# Patient Record
Sex: Female | Born: 1945 | Race: White | Hispanic: No | Smoking: Never smoker
Health system: Southern US, Community
[De-identification: ages and names within clinical notes are randomized; demographics above are authoritative.]

## PROBLEM LIST (undated history)

## (undated) DIAGNOSIS — I639 Cerebral infarction, unspecified: Secondary | ICD-10-CM

## (undated) DIAGNOSIS — I890 Lymphedema, not elsewhere classified: Secondary | ICD-10-CM

## (undated) DIAGNOSIS — L03116 Cellulitis of left lower limb: Secondary | ICD-10-CM

## (undated) DIAGNOSIS — I10 Essential (primary) hypertension: Secondary | ICD-10-CM

---

## 2010-02-18 ENCOUNTER — Inpatient Hospital Stay: Payer: Self-pay | Admitting: Internal Medicine

## 2010-02-26 ENCOUNTER — Inpatient Hospital Stay: Payer: Self-pay | Admitting: Internal Medicine

## 2010-02-26 ENCOUNTER — Ambulatory Visit: Payer: Self-pay | Admitting: Internal Medicine

## 2010-03-07 ENCOUNTER — Other Ambulatory Visit: Payer: Self-pay | Admitting: Geriatric Medicine

## 2010-03-29 ENCOUNTER — Ambulatory Visit: Payer: Self-pay | Admitting: Internal Medicine

## 2011-07-23 IMAGING — NM NM BONE LIMITED
3 series · 16 of 16 positions shown · non-contrast
Comparison: none

REASON FOR EXAM: part 2
COMMENTS:

PROCEDURE:     NM  - NM LIMITED BONE SCAN SINGLE AREA  - February 28, 2010  [DATE]
RESULT:
Evaluation of the lower extremities was obtained utilizing three phase bone
scan status post right hand injection of 22.0 mCi of technetium 99m labeled
MDP.

[Series 1000: delays · 4.80mm/px · 3 acquisitions, 6 frames shown]
[im 1/3]
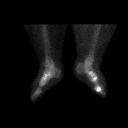
[im 1/3]
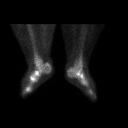
[im 2/3]
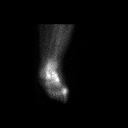
[im 2/3]
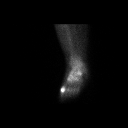
[im 3/3]
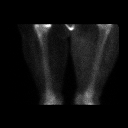
[im 3/3]
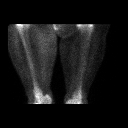

[Series 1000: immediate · 4.80mm/px · 2 acquisitions, 4 frames shown]
[im 1/2]
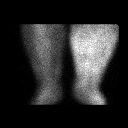
[im 1/2]
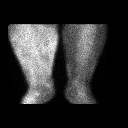
[im 2/2]
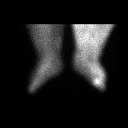
[im 2/2]
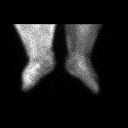

[Series 1000: flow · 4.80mm/px · 6 of 60 frames shown]
[frame 6/60  full-range]
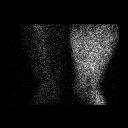
[frame 16/60  full-range]
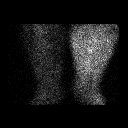
[frame 26/60  full-range]
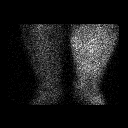
[frame 36/60  full-range]
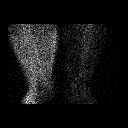
[frame 46/60  full-range]
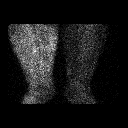
[frame 56/60  full-range]
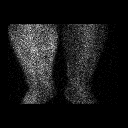

[16 of 16 positions shown; findings below may reference images not displayed]

FINDINGS: Diffuse hyperemia is identified within the distal left lower
extremity on the blood flow and blood pool images. No foci of abnormal
osseous remodeling are identified. On the delayed static images, punctate
foci of osseous remodeling project within the tarsals region of the left
foot as well as within the first tarsal. Vaguer areas are identified within
the tarsal regions of the right foot. These areas likely represent
degenerative change particularly considering the lack of increased
radiotracer activity within these regions on the blood flow and blood pool
imaging.
IMPRESSION: 1. Areas of osseous remodeling involving the tarsals on the left and to a
lesser extent the right as well as the first metatarsophalangeal region on
the left. These findings likely represent the sequela of degenerative
changes as described above.
2. Findings consistent with cellulitis involving the distal left lower
extremity.

## 2012-08-28 ENCOUNTER — Emergency Department: Payer: Self-pay | Admitting: Emergency Medicine

## 2012-08-28 LAB — CBC WITH DIFFERENTIAL/PLATELET
Basophil #: 0.1 10*3/uL (ref 0.0–0.1)
Eosinophil #: 0.3 10*3/uL (ref 0.0–0.7)
Eosinophil %: 2.9 %
HCT: 31.6 % — ABNORMAL LOW (ref 35.0–47.0)
HGB: 10.5 g/dL — ABNORMAL LOW (ref 12.0–16.0)
Lymphocyte %: 11.1 %
MCH: 28.2 pg (ref 26.0–34.0)
MCHC: 33.3 g/dL (ref 32.0–36.0)
Monocyte #: 0.8 x10 3/mm (ref 0.2–0.9)
Neutrophil #: 8.1 10*3/uL — ABNORMAL HIGH (ref 1.4–6.5)
Platelet: 226 10*3/uL (ref 150–440)
RBC: 3.72 10*6/uL — ABNORMAL LOW (ref 3.80–5.20)
WBC: 10.5 10*3/uL (ref 3.6–11.0)

## 2012-08-28 LAB — BASIC METABOLIC PANEL
Anion Gap: 4 — ABNORMAL LOW (ref 7–16)
BUN: 44 mg/dL — ABNORMAL HIGH (ref 7–18)
Calcium, Total: 9 mg/dL (ref 8.5–10.1)
Chloride: 104 mmol/L (ref 98–107)
Creatinine: 1.68 mg/dL — ABNORMAL HIGH (ref 0.60–1.30)
EGFR (African American): 36 — ABNORMAL LOW
EGFR (Non-African Amer.): 31 — ABNORMAL LOW
Potassium: 3.8 mmol/L (ref 3.5–5.1)
Sodium: 139 mmol/L (ref 136–145)

## 2015-02-02 ENCOUNTER — Encounter: Payer: Self-pay | Admitting: Emergency Medicine

## 2015-02-02 ENCOUNTER — Emergency Department: Payer: Medicare Other

## 2015-02-02 ENCOUNTER — Emergency Department
Admission: EM | Admit: 2015-02-02 | Discharge: 2015-02-02 | Disposition: A | Discharge status: Home / Self Care | Payer: Medicare Other | Attending: Emergency Medicine | Admitting: Emergency Medicine

## 2015-02-02 DIAGNOSIS — I10 Essential (primary) hypertension: Secondary | ICD-10-CM | POA: Diagnosis not present

## 2015-02-02 DIAGNOSIS — R531 Weakness: Secondary | ICD-10-CM | POA: Insufficient documentation

## 2015-02-02 DIAGNOSIS — R29898 Other symptoms and signs involving the musculoskeletal system: Secondary | ICD-10-CM

## 2015-02-02 DIAGNOSIS — Z91040 Latex allergy status: Secondary | ICD-10-CM | POA: Insufficient documentation

## 2015-02-02 HISTORY — DX: Morbid (severe) obesity due to excess calories: E66.01

## 2015-02-02 HISTORY — DX: Lymphedema, not elsewhere classified: I89.0

## 2015-02-02 HISTORY — DX: Cerebral infarction, unspecified: I63.9

## 2015-02-02 HISTORY — DX: Cellulitis of left lower limb: L03.116

## 2015-02-02 HISTORY — DX: Essential (primary) hypertension: I10

## 2015-02-02 LAB — CBC WITH DIFFERENTIAL/PLATELET
BASOS ABS: 0 10*3/uL (ref 0–0.1)
Basophils Relative: 0 %
EOS ABS: 0.4 10*3/uL (ref 0–0.7)
EOS PCT: 5 %
HCT: 28.2 % — ABNORMAL LOW (ref 35.0–47.0)
HEMOGLOBIN: 8.7 g/dL — AB (ref 12.0–16.0)
Lymphocytes Relative: 10 %
Lymphs Abs: 0.7 10*3/uL — ABNORMAL LOW (ref 1.0–3.6)
MCH: 29.4 pg (ref 26.0–34.0)
MCHC: 30.7 g/dL — ABNORMAL LOW (ref 32.0–36.0)
MCV: 95.7 fL (ref 80.0–100.0)
Monocytes Absolute: 0.8 10*3/uL (ref 0.2–0.9)
Monocytes Relative: 10 %
NEUTROS PCT: 75 %
Neutro Abs: 5.9 10*3/uL (ref 1.4–6.5)
PLATELETS: 272 10*3/uL (ref 150–440)
RBC: 2.95 MIL/uL — AB (ref 3.80–5.20)
RDW: 17.8 % — ABNORMAL HIGH (ref 11.5–14.5)
WBC: 7.8 10*3/uL (ref 3.6–11.0)

## 2015-02-02 LAB — URINALYSIS COMPLETE WITH MICROSCOPIC (ARMC ONLY)
BILIRUBIN URINE: NEGATIVE
Bacteria, UA: NONE SEEN
GLUCOSE, UA: NEGATIVE mg/dL
Hgb urine dipstick: NEGATIVE
Ketones, ur: NEGATIVE mg/dL
Leukocytes, UA: NEGATIVE
NITRITE: NEGATIVE
PH: 5 (ref 5.0–8.0)
Protein, ur: NEGATIVE mg/dL
Specific Gravity, Urine: 1.008 (ref 1.005–1.030)
WBC UA: NONE SEEN WBC/hpf (ref 0–5)

## 2015-02-02 LAB — BASIC METABOLIC PANEL
ANION GAP: 5 (ref 5–15)
BUN: 28 mg/dL — ABNORMAL HIGH (ref 6–20)
CHLORIDE: 104 mmol/L (ref 101–111)
CO2: 27 mmol/L (ref 22–32)
Calcium: 8.2 mg/dL — ABNORMAL LOW (ref 8.9–10.3)
Creatinine, Ser: 1.73 mg/dL — ABNORMAL HIGH (ref 0.44–1.00)
GFR, EST AFRICAN AMERICAN: 34 mL/min — AB (ref >60)
GFR, EST NON AFRICAN AMERICAN: 29 mL/min — AB (ref >60)
Glucose, Bld: 110 mg/dL — ABNORMAL HIGH (ref 65–99)
POTASSIUM: 4.1 mmol/L (ref 3.5–5.1)
SODIUM: 136 mmol/L (ref 135–145)

## 2015-02-02 LAB — PROTIME-INR
INR: 1.09
PROTHROMBIN TIME: 14.3 s (ref 11.4–15.0)

## 2015-02-02 LAB — APTT: aPTT: 34 seconds (ref 24–36)

## 2015-02-02 MED ORDER — HYDROCODONE-ACETAMINOPHEN 5-325 MG PO TABS
ORAL_TABLET | ORAL | Status: AC
  Administered 2015-02-02: 1 via ORAL
  Filled 2015-02-02: qty 1

## 2015-02-02 MED ORDER — HYDROCODONE-ACETAMINOPHEN 5-325 MG PO TABS
1.0000 | ORAL_TABLET | Freq: Once | ORAL | Status: AC
  Administered 2015-02-02: 1 via ORAL

## 2015-02-02 NOTE — ED Notes (Addendum)
Pt attempted to use bedpan, unable to collect sample. Skin breakdown noted on posterior inner thighs.

## 2015-02-02 NOTE — ED Notes (Signed)
Pt's legs wrapped according to the way they wrap them at home, non stick gauze placed to large wound to the posterior left lower leg, with abd pads and gauze and coban to hold intact to bilat lower extrem.

## 2015-02-02 NOTE — Clinical Social Work Note (Signed)
Clinical Social Work Assessment  Patient Details  Name: Rachel Craig MRN: [id number] Date of Birth: [birth year]  Date of referral:  02/02/15               Reason for consult:  [facility] Placement, [facility]                Permission sought to share information with:    Permission granted to share information::     Name::        Agency::     Relationship::     Contact Information:     Housing/Transportation Living arrangements for the past 2 months:  Single Family Home Source of Information:  Medical Team, Power of HastingsAttorney, Adult Children Patient Interpreter Needed:  None Criminal Activity/Legal Involvement Pertinent to Current Situation/Hospitalization:   (none reported) Significant Relationships:  Adult Children, Other Family Members Lives with:  Self Do you feel safe going back to the place where you live?   (per daughter patient needs help) Need for family participation in patient care:  Yes (Comment)  Care giving concerns:  Family is concerned that patient is not able to care for herself.   Social Worker assessment / plan:  Patient is 69 year old female that lives alone.  Patient is obese and sleeps in a power chair lift chair.  She has home health services with an RN that assist patient with her leg wraps. Daughter states they are also going to ask for a Child psychotherapistsocial worker to assist.   Per daughter the only way patient is able to travel is EMS as she is unable to get into a car.  Patient uses a walker to ambulate at home. Daughter stated notice patient is getting to the point of not being able to care for self, states patient has been total care as they assist with giving patient's bath and cooking her food.  Patient's PCP also comes to her home to see her.  CSW provided patient and family with a list of skilled nursing facilities, a blank FL2 to take to her PCP, contact phone and location for DSS, and a fact sheet with information on Medicaid for long term  care.  Employment status:  Disabled (Comment on whether or not currently receiving Disability) Insurance information:  Medicare Education officer, museum([employer]) PT Recommendations:  Not assessed at this time Information / Referral to community resources:  Skilled Holiday representativeursing Facility, Other (Comment Required) (information for Skilled and information on medicaid application process)  Patient/Family's Response to care:  Patient's daughters were appreciative of information provided by CSW and will follow up tomorrow.   Patient/Family's Understanding of and Emotional Response to Diagnosis, Current Treatment, and Prognosis:  Patient and daughter understands that patient is will not be admitted to the hospital, they will follow up with patient's PCP to complete FL2 and start the process to get patient placed in SNF.  Emotional Assessment Appearance:  Appears older than stated age Attitude/Demeanor/Rapport:    Affect (typically observed):    Orientation:  Oriented to Self, Oriented to Place, Oriented to  Time, Oriented to Situation Alcohol / Substance use:  Never Used Psych involvement (Current and /or in the community):  No (Comment)  Discharge Needs  Concerns to be addressed:  Care Coordination Readmission within the last 30 days:  No Current discharge risk:  Chronically ill, Dependent with Mobility, Lives alone Barriers to Discharge:  No Barriers Identified   Soundra PilonMoore, Deborah H, LCSW 02/02/2015, 5:41 PM

## 2015-02-02 NOTE — ED Notes (Signed)
Per pt's request in and out cath performed to empty pt's bladder, pt tol well, approx 600 ml's removed

## 2015-02-02 NOTE — ED Provider Notes (Signed)
[facility] Emergency Department Provider Note  ____________________________________________  Time seen: Seen upon arrival to the emergency department  I have reviewed the triage vital signs and the nursing notes.   HISTORY  Chief Complaint Weakness    HPI Rachel Craig is a 69 y.o. female with a history of morbid obesity, hypertension and lymphedema of the lower extremities was presenting with weakness to the bilateral lower 70s as well as pain to the left lower extremity. She says that the pain to the lower extremity has been there for about 2 weeks. She says that over the past day and a half dose she has been having weakness. She does take a Lasix 40 mg daily at home which she has been compliant with. She denies any increased swelling of her bilateral lower extremities. She says that the appearance as far as the redness is at her baseline. Denies any fever at home as well. Denies any other pain or nausea or vomiting.She says the weakness has progressed to the point where she has been unable to ambulate. Denies any pain in her back. Denies any urinary or bowel incontinence.   Past Medical History  Diagnosis Date  . Stroke (HCC)   . Hypertension   . Lymphedema of both lower extremities   . Hypertension   . Morbid obesity (HCC)   . Cellulitis of left lower extremity     There are no active problems to display for this patient.   History reviewed. No pertinent past surgical history.  No current outpatient prescriptions on file.  Allergies Latex and Other  History reviewed. No pertinent family history.  Social History Social History  Substance Use Topics  . Smoking status: Never Smoker   . Smokeless tobacco: None  . Alcohol Use: No    Review of Systems Constitutional: Bilateral lower extremity weakness Eyes: No visual changes. ENT: No sore throat. Cardiovascular: Denies chest pain. Respiratory: Denies shortness of  breath. Gastrointestinal: No abdominal pain.  No nausea, no vomiting.  No diarrhea.  No constipation. Genitourinary: Negative for dysuria. Musculoskeletal: Negative for back pain. Skin: Negative for rash. Neurological: Negative for headaches, focal weakness or numbness.  10-point ROS otherwise negative.  ____________________________________________   PHYSICAL EXAM:  VITAL SIGNS: ED Triage Vitals  Enc Vitals Group     BP 02/02/15 1330 145/51 mmHg     Pulse Rate 02/02/15 1330 72     Resp 02/02/15 1330 18     Temp 02/02/15 1330 98.2 F (36.8 C)     Temp Source 02/02/15 1330 Oral     SpO2 02/02/15 1330 95 %     Weight --      Height --      Head Cir --      Peak Flow --      Pain Score 02/02/15 1330 0     Pain Loc --      Pain Edu? --      Excl. in GC? --     Constitutional: Alert and oriented. Well appearing and in no acute distress. Morbidly obese. Eyes: Conjunctivae are normal. PERRL. EOMI. Head: Atraumatic. Nose: No congestion/rhinnorhea. Mouth/Throat: Mucous membranes are moist.  Oropharynx non-erythematous. Neck: No stridor.   Cardiovascular: Normal rate, regular rhythm. Grossly normal heart sounds.  Good peripheral circulation. Respiratory: Normal respiratory effort.  No retractions. Lungs CTAB. Gastrointestinal: Soft and nontender. No distention. No abdominal bruits. No CVA tenderness. Nonpainful hemorrhoids at 5 and 6:00. Mild engorgement. Brown stool which is heme-negative. Musculoskeletal: Severe swelling  to the bilateral lower extremities with erythema and warmth to both sides. To the left side there is a serous drainage especially at the posterior were there is a ulceration with an eschar about 4 x 10 cm in diameter.  Neurologic:  Weak 4 out of 5 strength to bilateral lower extremities. Sensation is intact to light touch.  Skin:  Skin is warm, dry and intact. No rash noted. Psychiatric: Mood and affect are normal. Speech and behavior are  normal.  ____________________________________________   LABS (all labs ordered are listed, but only abnormal results are displayed)  Labs Reviewed  CBC WITH DIFFERENTIAL/PLATELET - Abnormal; Notable for the following:    RBC 2.95 (*)    Hemoglobin 8.7 (*)    HCT 28.2 (*)    MCHC 30.7 (*)    RDW 17.8 (*)    Lymphs Abs 0.7 (*)    All other components within normal limits  BASIC METABOLIC PANEL - Abnormal; Notable for the following:    Glucose, Bld 110 (*)    BUN 28 (*)    Creatinine, Ser 1.73 (*)    Calcium 8.2 (*)    GFR calc non Af Amer 29 (*)    GFR calc Af Amer 34 (*)    All other components within normal limits  URINALYSIS COMPLETEWITH MICROSCOPIC ([facility] ONLY) - Abnormal; Notable for the following:    Color, Urine STRAW (*)    APPearance CLEAR (*)    Squamous Epithelial / LPF 0-5 (*)    All other components within normal limits  PROTIME-INR  APTT   ____________________________________________  EKG   ____________________________________________  RADIOLOGY  Exam limited by body habitus but there are no signs of DVT. ____________________________________________   PROCEDURES   ____________________________________________   INITIAL IMPRESSION / ASSESSMENT AND PLAN / ED COURSE  Pertinent labs & imaging results that were available during my care of the patient were reviewed by me and considered in my medical decision making (see chart for details).  ----------------------------------------- 3:58 PM on 02/02/2015 -----------------------------------------  Patient's daughter is now the bedside and says that the patient has had this weakness ongoing for several months. She says it has worsened recently with the patient calling the ambulance more often. She says that she has called ambulance for lift assist. Has not come to the hospital on these calls. The family is concerned for the patient's well-being and that she is homebound and her current condition. She is  supposed to be seeing a wound specialist but she cannot get out of the house because of her weakness and mobility issues. Social worker contacted and will speak with family.  ----------------------------------------- 6:27 PM on 02/02/2015 -----------------------------------------  Social worker discussed options with the family including discussing placement through the primary care doctor as well as through Medicare. The family will need to apply for Medicare. They understand that these are chronic issues and that there does not appear to be an acute issue to admit the patient to the hospital at this time. The family is agreeable for the patient to go home. They understand the plan for placement through their primary care doctor. The patient's legs will be rewrapped with UNA boots.   ____________________________________________   FINAL CLINICAL IMPRESSION(S) / ED DIAGNOSES  Acute on chronic weakness.    Myrna Blazer, MD 02/02/15 [phone]

## 2015-02-02 NOTE — ED Notes (Signed)
[age] yof presents from home for c/c leg weakness. BLE with chronic edema.

## 2015-05-06 ENCOUNTER — Other Ambulatory Visit: Payer: Self-pay

## 2015-12-28 DEATH — deceased

## 2016-10-10 IMAGING — US US EXTREM LOW VENOUS BILAT
1 series · 13 of 24 positions shown · non-contrast
Comparison: None.

CLINICAL DATA: Pain and swelling



[Series 1: us extrem low venous bilat · 0.19mm/px · 13 of 56 slices shown]
[im 1/56]
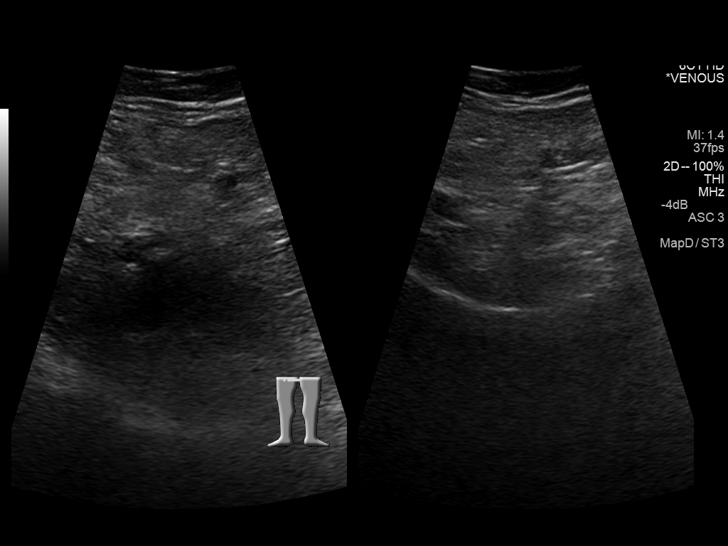
[im 5/56]
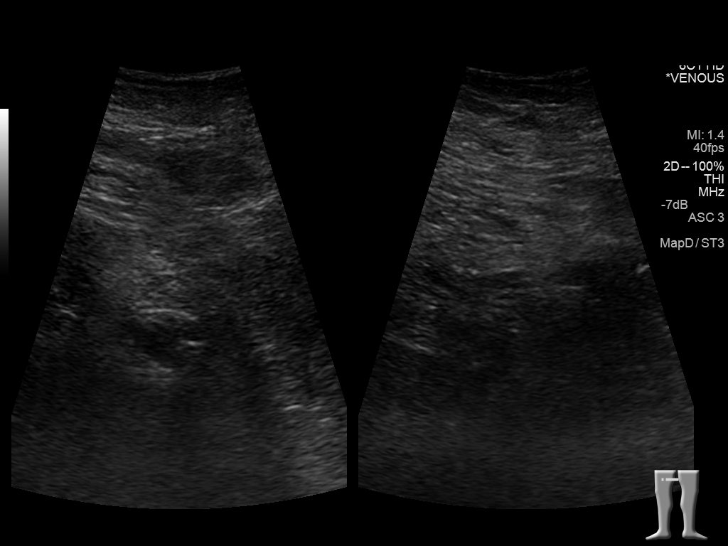
[im 10/56]
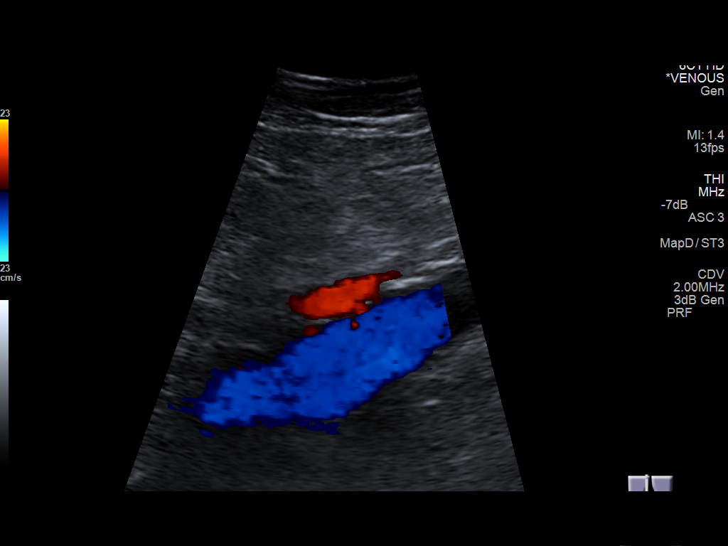
[im 15/56]
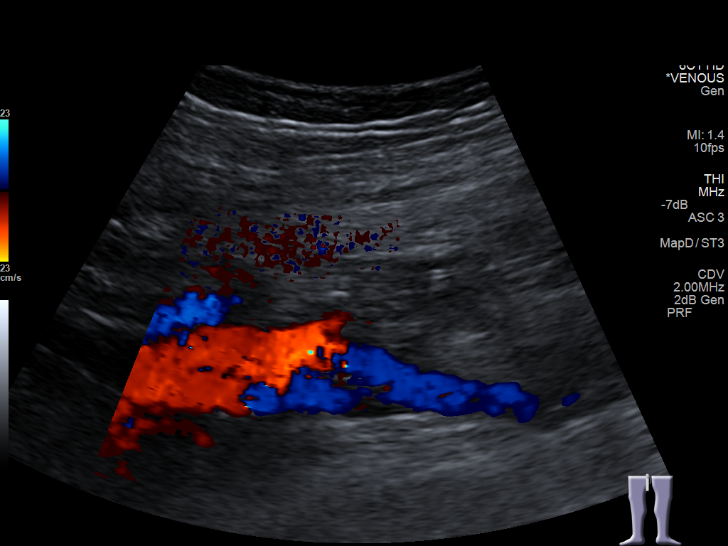
[im 20/56]
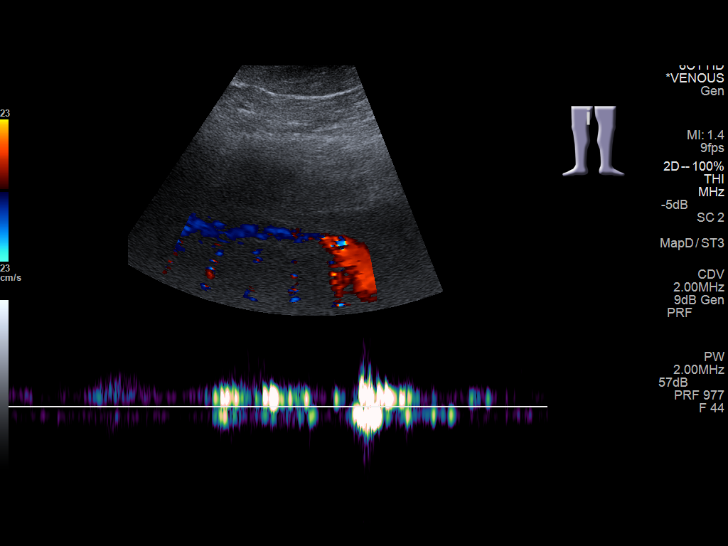
[im 24/56]
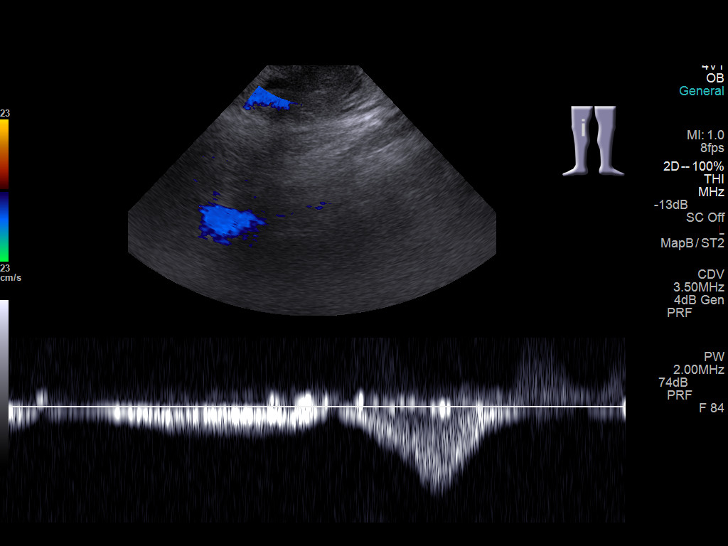
[im 29/56]
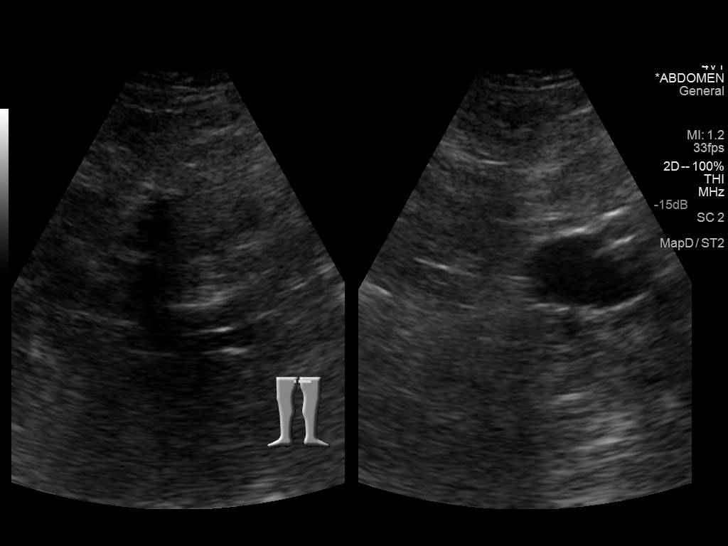
[im 32/56]
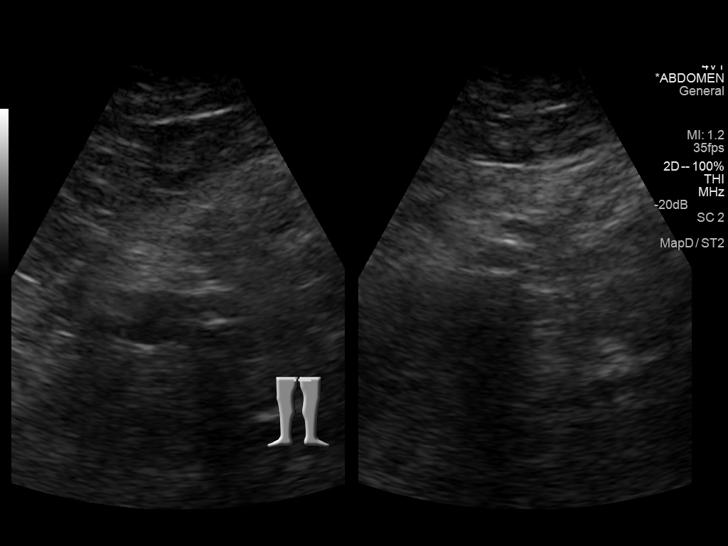
[im 36/56]
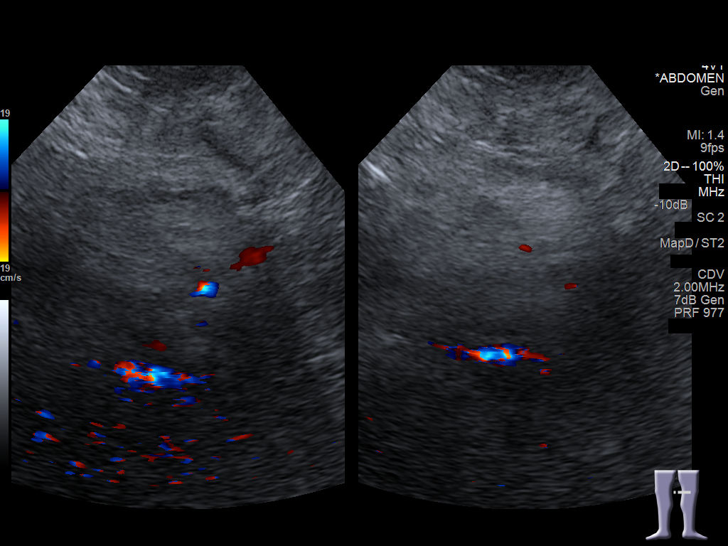
[im 41/56]
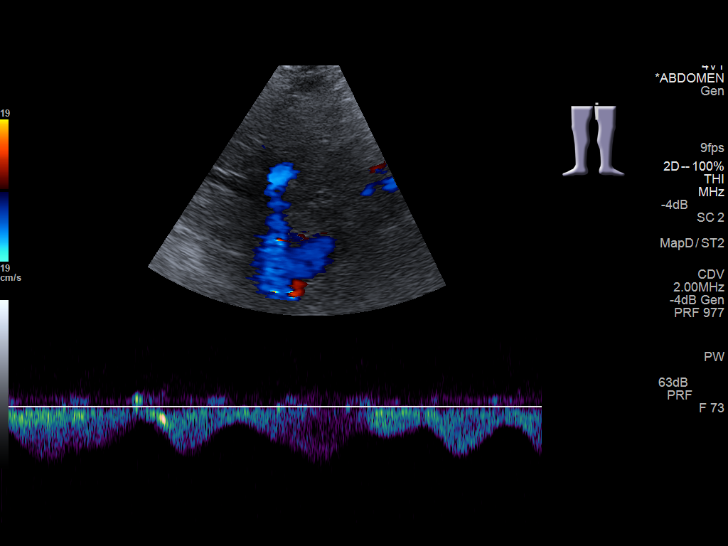
[im 46/56]
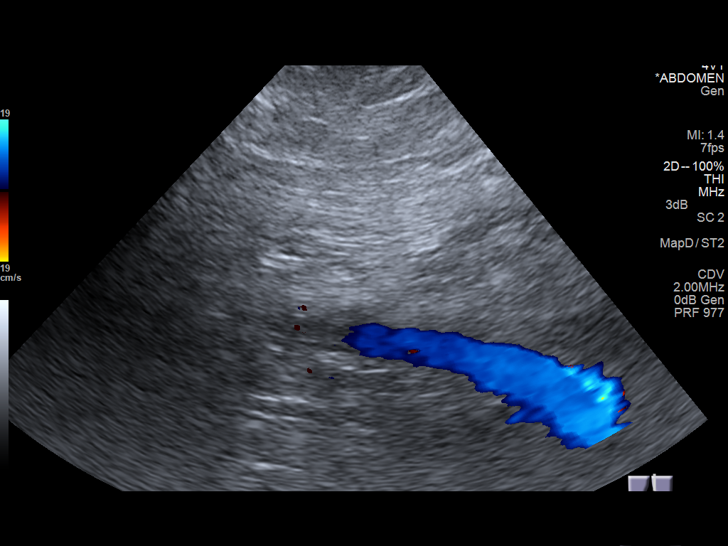
[im 51/56]
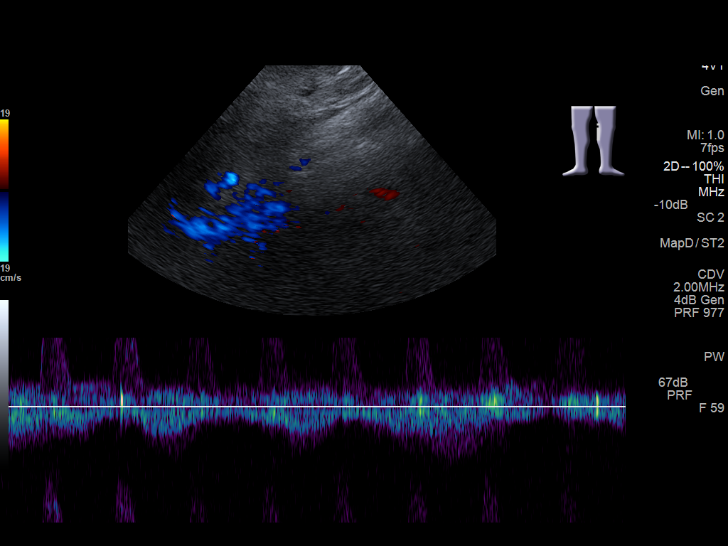
[im 56/56]
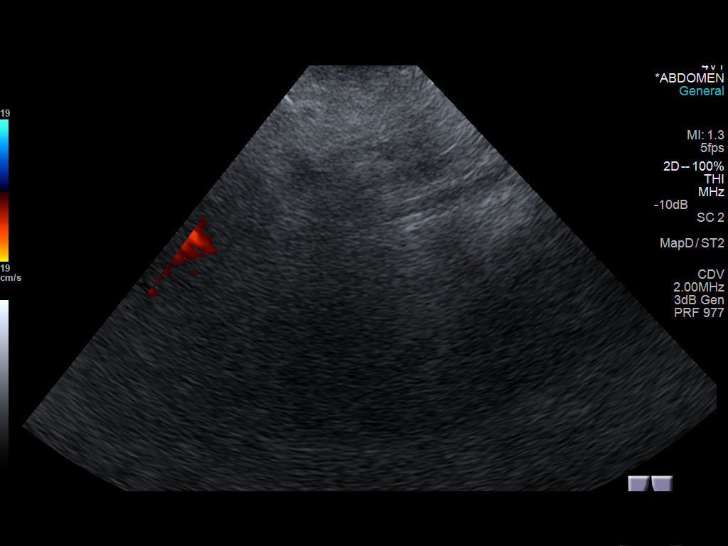

[13 of 24 positions shown; findings below may reference images not displayed]

FINDINGS: Exam detail diminished secondary to body habitus.

RIGHT LOWER EXTREMITY

Common Femoral Vein: No evidence of thrombus. Normal
compressibility, respiratory phasicity and response to augmentation.

Saphenofemoral Junction: No evidence of thrombus. Normal
compressibility and flow on color Doppler imaging.

Profunda Femoral Vein: No evidence of thrombus. Normal
compressibility and flow on color Doppler imaging.

Femoral Vein: No evidence of thrombus. Normal compressibility,
respiratory phasicity and response to augmentation.

Popliteal Vein: No evidence of thrombus. Normal compressibility,
respiratory phasicity and response to augmentation.

Calf Veins: Not visualized.

Superficial Great Saphenous Vein: No evidence of thrombus. Normal
compressibility and flow on color Doppler imaging.

Venous Reflux:  None.

Other Findings:  None.

LEFT LOWER EXTREMITY

Common Femoral Vein: No evidence of thrombus. Normal
compressibility, respiratory phasicity and response to augmentation.

Saphenofemoral Junction: No evidence of thrombus. Normal
compressibility and flow on color Doppler imaging.

Profunda Femoral Vein: No evidence of thrombus. Normal
compressibility and flow on color Doppler imaging.

Femoral Vein: No evidence of thrombus. Normal compressibility,
respiratory phasicity and response to augmentation.

Popliteal Vein: No evidence of thrombus. Normal compressibility,
respiratory phasicity and response to augmentation.

Calf Veins: Not well visualized.

Superficial Great Saphenous Vein: No evidence of thrombus. Normal
compressibility and flow on color Doppler imaging.

Venous Reflux:  None.

Other Findings:  None.
IMPRESSION: 1. Exam detail diminished secondary to body habitus.
2. No evidence for deep vein thrombosis.
